# Patient Record
Sex: Male | Born: 1990 | Race: Black or African American | Hispanic: No | Marital: Single
Health system: Southern US, Community
[De-identification: ages and names within clinical notes are randomized; demographics above are authoritative.]

---

## 2019-08-06 ENCOUNTER — Emergency Department
Admission: EM | Admit: 2019-08-06 | Discharge: 2019-08-06 | Disposition: A | Discharge status: Home / Self Care | Payer: BC Managed Care – PPO | Attending: Emergency Medicine | Admitting: Emergency Medicine

## 2019-08-06 ENCOUNTER — Encounter: Payer: Self-pay | Admitting: Radiology

## 2019-08-06 ENCOUNTER — Emergency Department: Payer: BC Managed Care – PPO

## 2019-08-06 ENCOUNTER — Other Ambulatory Visit: Payer: Self-pay

## 2019-08-06 DIAGNOSIS — J029 Acute pharyngitis, unspecified: Secondary | ICD-10-CM | POA: Insufficient documentation

## 2019-08-06 DIAGNOSIS — R51 Headache: Secondary | ICD-10-CM | POA: Diagnosis not present

## 2019-08-06 DIAGNOSIS — M791 Myalgia, unspecified site: Secondary | ICD-10-CM | POA: Insufficient documentation

## 2019-08-06 DIAGNOSIS — A389 Scarlet fever, uncomplicated: Secondary | ICD-10-CM | POA: Diagnosis not present

## 2019-08-06 DIAGNOSIS — Z20828 Contact with and (suspected) exposure to other viral communicable diseases: Secondary | ICD-10-CM | POA: Diagnosis not present

## 2019-08-06 LAB — CBC WITH DIFFERENTIAL/PLATELET
Abs Immature Granulocytes: 0.04 10*3/uL (ref 0.00–0.07)
Basophils Absolute: 0.1 10*3/uL (ref 0.0–0.1)
Basophils Relative: 0 %
Eosinophils Absolute: 0.1 10*3/uL (ref 0.0–0.5)
Eosinophils Relative: 1 %
HCT: 44.8 % (ref 39.0–52.0)
Hemoglobin: 15.7 g/dL (ref 13.0–17.0)
Immature Granulocytes: 0 %
Lymphocytes Relative: 21 %
Lymphs Abs: 2.4 10*3/uL (ref 0.7–4.0)
MCH: 27.6 pg (ref 26.0–34.0)
MCHC: 35 g/dL (ref 30.0–36.0)
MCV: 78.9 fL — ABNORMAL LOW (ref 80.0–100.0)
Monocytes Absolute: 1.6 10*3/uL — ABNORMAL HIGH (ref 0.1–1.0)
Monocytes Relative: 14 %
Neutro Abs: 7.4 10*3/uL (ref 1.7–7.7)
Neutrophils Relative %: 64 %
Platelets: 186 10*3/uL (ref 150–400)
RBC: 5.68 MIL/uL (ref 4.22–5.81)
RDW: 12.5 % (ref 11.5–15.5)
WBC: 11.5 10*3/uL — ABNORMAL HIGH (ref 4.0–10.5)
nRBC: 0 % (ref 0.0–0.2)

## 2019-08-06 LAB — COMPREHENSIVE METABOLIC PANEL
ALT: 18 U/L (ref 0–44)
AST: 22 U/L (ref 15–41)
Albumin: 4.4 g/dL (ref 3.5–5.0)
Alkaline Phosphatase: 48 U/L (ref 38–126)
Anion gap: 10 (ref 5–15)
BUN: 16 mg/dL (ref 6–20)
CO2: 26 mmol/L (ref 22–32)
Calcium: 9 mg/dL (ref 8.9–10.3)
Chloride: 102 mmol/L (ref 98–111)
Creatinine, Ser: 1.33 mg/dL — ABNORMAL HIGH (ref 0.61–1.24)
GFR calc Af Amer: >60 mL/min (ref >60)
GFR calc non Af Amer: >60 mL/min (ref >60)
Glucose, Bld: 106 mg/dL — ABNORMAL HIGH (ref 70–99)
Potassium: 3.5 mmol/L (ref 3.5–5.1)
Sodium: 138 mmol/L (ref 135–145)
Total Bilirubin: 1 mg/dL (ref 0.3–1.2)
Total Protein: 8 g/dL (ref 6.5–8.1)

## 2019-08-06 LAB — SARS CORONAVIRUS 2 BY RT PCR (HOSPITAL ORDER, PERFORMED IN ~~LOC~~ HOSPITAL LAB): SARS Coronavirus 2: NEGATIVE

## 2019-08-06 LAB — LACTIC ACID, PLASMA: Lactic Acid, Venous: 1 mmol/L (ref 0.5–1.9)

## 2019-08-06 LAB — GROUP A STREP BY PCR: Group A Strep by PCR: NOT DETECTED

## 2019-08-06 MED ORDER — IOHEXOL 300 MG/ML  SOLN
75.0000 mL | Freq: Once | INTRAMUSCULAR | Status: AC | PRN
  Administered 2019-08-06: 75 mL via INTRAVENOUS
  Filled 2019-08-06: qty 75

## 2019-08-06 MED ORDER — SODIUM CHLORIDE 0.9 % IV SOLN
3.0000 g | Freq: Once | INTRAVENOUS | Status: AC
  Administered 2019-08-06: 20:00:00 3 g via INTRAVENOUS
  Filled 2019-08-06: qty 8

## 2019-08-06 MED ORDER — DEXAMETHASONE SODIUM PHOSPHATE 10 MG/ML IJ SOLN
10.0000 mg | Freq: Once | INTRAMUSCULAR | Status: AC
  Administered 2019-08-06: 10 mg via INTRAVENOUS
  Filled 2019-08-06 (×2): qty 1

## 2019-08-06 MED ORDER — ACETAMINOPHEN 325 MG PO TABS
ORAL_TABLET | ORAL | Status: AC
  Filled 2019-08-06: qty 2

## 2019-08-06 MED ORDER — AMOXICILLIN-POT CLAVULANATE 875-125 MG PO TABS: 1.0000 | ORAL_TABLET | Freq: Two times a day (BID) | ORAL | 0 refills | Status: AC

## 2019-08-06 MED ORDER — SODIUM CHLORIDE 0.9 % IV BOLUS
1000.0000 mL | Freq: Once | INTRAVENOUS | Status: AC
  Administered 2019-08-06: 1000 mL via INTRAVENOUS

## 2019-08-06 MED ORDER — ACETAMINOPHEN 325 MG PO TABS
650.0000 mg | ORAL_TABLET | Freq: Once | ORAL | Status: AC | PRN
  Administered 2019-08-06: 650 mg via ORAL

## 2019-08-06 NOTE — ED Provider Notes (Signed)
[facility] Emergency Department Provider Note  ____________________________________________  Time seen: Approximately 7:50 PM  I have reviewed the triage vital signs and the nursing notes.   HISTORY  Chief Complaint Sore Throat and Generalized Body Aches    HPI Benjamin Moore is a 28 y.o. male presents to the emergency department with fever, headache, sore throat, body aches and diarrhea for the past 2 to 3 days.  Patient denies nasal congestion, rhinorrhea or nonproductive cough.  Patient states that he does not ordinarily go to the doctor but after several days and symptomatic, "help".  He states that he has been managing his own secretions at home.  He denies recent travel.  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  He denies chest pain, chest tightness or abdominal pain.        History reviewed. No pertinent past medical history.  There are no active problems to display for this patient.     Prior to Admission medications   Medication Sig Start Date End Date Taking? Authorizing Provider  amoxicillin-clavulanate (AUGMENTIN) 875-125 MG tablet Take 1 tablet by mouth 2 (two) times daily for 10 days. 08/06/19 08/16/19  Lannie Fields, PA-C    Allergies Patient has no known allergies.  No family history on file.  Social History Social History   Tobacco Use  . Smoking status: Not on file  Substance Use Topics  . Alcohol use: Not on file  . Drug use: Not on file      Review of Systems  Constitutional: Patient has fever.  Eyes: No visual changes. No discharge ENT: Patient has pharyngitis. Cardiovascular: no chest pain. Respiratory: Patient has cough.  Gastrointestinal: No abdominal pain.  No nausea, no vomiting. Patient had diarrhea.  Genitourinary: Negative for dysuria. No hematuria Musculoskeletal: Patient has myalgias.  Skin: Negative for rash, abrasions, lacerations, ecchymosis. Neurological:  Patient has headache, no focal weakness or numbness.     ____________________________________________   PHYSICAL EXAM:  VITAL SIGNS: ED Triage Vitals  Enc Vitals Group     BP 08/06/19 1904 110/80     Pulse Rate 08/06/19 1806 (!) 130     Resp 08/06/19 1904 20     Temp 08/06/19 1806 (!) 103.2 F (39.6 C)     Temp Source 08/06/19 1806 Oral     SpO2 08/06/19 1904 96 %     Weight 08/06/19 1807 148 lb (67.1 kg)     Height 08/06/19 1807 5\' 5"  (1.651 m)     Head Circumference --      Peak Flow --      Pain Score 08/06/19 1806 0     Pain Loc --      Pain Edu? --      Excl. in [place]? --      Constitutional: Alert and oriented. Patient is lying supine. Eyes: Conjunctivae are normal. PERRL. EOMI. Head: Atraumatic. ENT:      Ears: Tympanic membranes are mildly injected with mild effusion bilaterally.       Nose: No congestion/rhinnorhea.      Mouth/Throat: Mucous membranes are moist.  Posterior pharynx is erythematous.  Patient has very little tonsillar matter left but right tonsil is larger than left.  No uvular deviation. Hematological/Lymphatic/Immunilogical: Palpable cervical lymphadenopathy.  Cardiovascular: Normal rate, regular rhythm. Normal S1 and S2.  Good peripheral circulation. Respiratory: Normal respiratory effort without tachypnea or retractions. Lungs CTAB. Good air entry to the bases with no decreased or absent breath sounds. Gastrointestinal:  Bowel sounds 4 quadrants. Soft and nontender to palpation. No guarding or rigidity. No palpable masses. No distention. No CVA tenderness. Musculoskeletal: Full range of motion to all extremities. No gross deformities appreciated. Neurologic:  Normal speech and language. No gross focal neurologic deficits are appreciated.  Skin: Patient has dry, sandpaperlike rash of face. Psychiatric: Mood and affect are normal. Speech and behavior are normal. Patient exhibits appropriate insight and  judgement.    ____________________________________________   LABS (all labs ordered are listed, but only abnormal results are displayed)  Labs Reviewed  CBC WITH DIFFERENTIAL/PLATELET - Abnormal; Notable for the following components:      Result Value   WBC 11.5 (*)    MCV 78.9 (*)    Monocytes Absolute 1.6 (*)    All other components within normal limits  COMPREHENSIVE METABOLIC PANEL - Abnormal; Notable for the following components:   Glucose, Bld 106 (*)    Creatinine, Ser 1.33 (*)    All other components within normal limits  SARS CORONAVIRUS 2 (HOSPITAL ORDER, PERFORMED IN [facility])  GROUP A STREP BY PCR  CULTURE, BLOOD (ROUTINE X 2)  CULTURE, BLOOD (ROUTINE X 2)  LACTIC ACID, PLASMA  LACTIC ACID, PLASMA   ____________________________________________  EKG   ____________________________________________  RADIOLOGY I personally viewed and evaluated these images as part of my medical decision making, as well as reviewing the written report by the radiologist.    Ct Soft Tissue Neck W Contrast  Result Date: 08/06/2019 CLINICAL DATA:  Sore throat over the last 2 days. EXAM: CT NECK WITH CONTRAST TECHNIQUE: Multidetector CT imaging of the neck was performed using the standard protocol following the bolus administration of intravenous contrast. CONTRAST:  23mL OMNIPAQUE IOHEXOL 300 MG/ML  SOLN COMPARISON:  None. FINDINGS: Pharynx and larynx: Mucosal prominence of the pharyngeal region and tonsils most consistent with inflammatory disease. Mild indistinct low density of the right tonsil without discrete tonsillar or peritonsillar abscess. No retropharyngeal edema or abscess. Salivary glands: Parotid and submandibular glands are normal. Thyroid: Normal Lymph nodes: Mild reactive nodal prominence without dominant enlarged node or suppuration. Vascular: Normal Limited intracranial: Normal Visualized orbits: Normal Mastoids and visualized paranasal sinuses: Clear  Skeleton: Normal Upper chest: Normal Other: None IMPRESSION: Consistent with pharyngitis and tonsillitis. No evidence of abscess. No retropharyngeal disease. Mild reactive nodal prominence. Electronically Signed   By: Paulina Fusi M.D.   On: 08/06/2019 20:11    ____________________________________________    PROCEDURES  Procedure(s) performed:    Procedures    Medications  acetaminophen (TYLENOL) tablet 650 mg (650 mg Oral Given 08/06/19 1813)  Ampicillin-Sulbactam (UNASYN) 3 g in sodium chloride 0.9 % 100 mL IVPB (0 g Intravenous Stopped 08/06/19 2005)  sodium chloride 0.9 % bolus 1,000 mL (0 mLs Intravenous Stopped 08/06/19 2106)  dexamethasone (DECADRON) injection 10 mg (10 mg Intravenous Given 08/06/19 1935)  iohexol (OMNIPAQUE) 300 MG/ML solution 75 mL (75 mLs Intravenous Contrast Given 08/06/19 1956)     ____________________________________________   INITIAL IMPRESSION / ASSESSMENT AND PLAN / ED COURSE  Pertinent labs & imaging results that were available during my care of the patient were reviewed by me and considered in my medical decision making (see chart for details).  Review of the [place] CSRS was performed in accordance of the [facility] prior to dispensing any controlled drugs.           Assessment and Plan:  Severe pharyngitis 28 year old male presents to the emergency department with pharyngitis, headache, body aches and  nausea for the past 2 to 3 days.  Patient was febrile and tachycardic at triage.  Right tonsil appears larger than left on physical exam and patient had tender anterior cervical lymphadenopathy.  Patient had evidence of dry, papular rash of face.  Differential diagnosis includes peritonsillar abscess, group A strep pharyngitis, mono, COVID-19...  Patient was given supplemental fluids in the emergency department as well as Unasyn.  Patient was also given IV Decadron.  Group A strep testing was negative.  COVID-19 testing was negative in the emergency  department.  CT soft tissue neck revealed no evidence of peritonsillar abscess.  Headache, nausea, pharyngitis, tender anterior cervical lymphadenopathy and scarlatina increase suspicion for group A strep pharyngitis.  Patient was discharged with Augmentin.  Strict return precautions were given to return to the emergency department with new or worsening symptoms.  All patient questions were answered.  ____________________________________________  FINAL CLINICAL IMPRESSION(S) / ED DIAGNOSES  Final diagnoses:  Pharyngitis, unspecified etiology  Scarlatina      NEW MEDICATIONS STARTED DURING THIS VISIT:  ED Discharge Orders         Ordered    amoxicillin-clavulanate (AUGMENTIN) 875-125 MG tablet  2 times daily     08/06/19 2050              This chart was dictated using voice recognition software/Dragon. Despite best efforts to proofread, errors can occur which can change the meaning. Any change was purely unintentional.    Gasper LloydWoods, Maquita Sandoval M, PA-C 08/06/19 2155    Sharman CheekStafford, Phillip, MD 08/11/19 [phone]

## 2019-08-06 NOTE — ED Notes (Signed)
Patient transported to CT 

## 2019-08-06 NOTE — ED Triage Notes (Signed)
Pt reports for the past 2 days he has been having some sore throat, headache, body aches, and feeling hot with a little diarrhea, states most of his symptoms bother him at night.

## 2019-08-06 NOTE — ED Notes (Signed)
See triage note.  States he developed some diarrhea couple of days ago  Now fever with sore throat ,h/a and body aches

## 2019-08-11 LAB — CULTURE, BLOOD (ROUTINE X 2)
Culture: NO GROWTH
Culture: NO GROWTH

## 2020-10-21 IMAGING — CT CT NECK W/ CM
4 series · 15 of 33 positions shown, 18 images · IV contrast (omnipaque)
Comparison: None.

CLINICAL DATA: Sore throat over the last 2 days.

EXAM:
CT NECK WITH CONTRAST
TECHNIQUE: Multidetector CT imaging of the neck was performed using the
standard protocol following the bolus administration of intravenous
contrast.
CONTRAST:  75mL OMNIPAQUE IOHEXOL 300 MG/ML  SOLN

[Series 2: axial neck · axial · 0.55mm/px · z∈[+227,+367]mm · 5 of 106 slices shown, 7 images]
[im 18/106  soft-tissue]
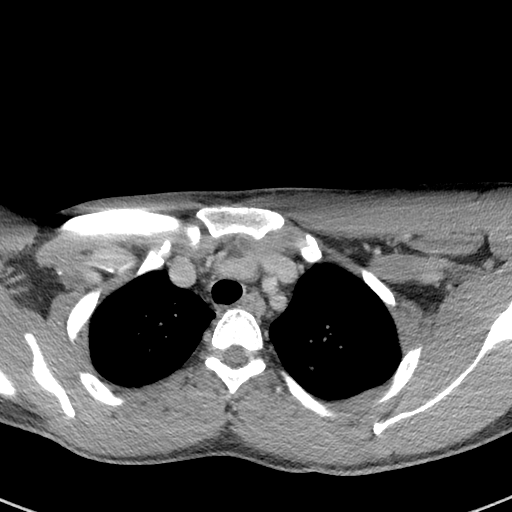
[im 18/106  bone]
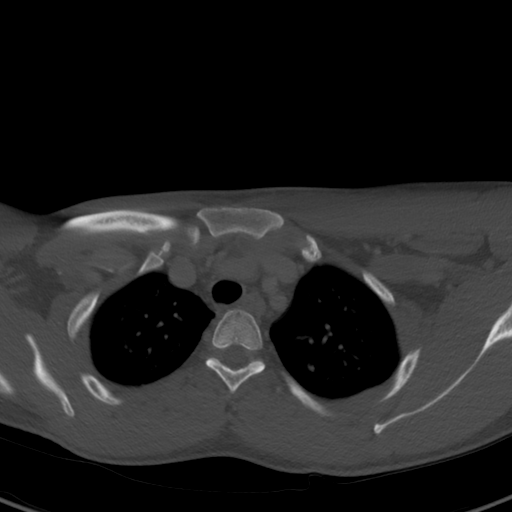
[im 36/106  bone]
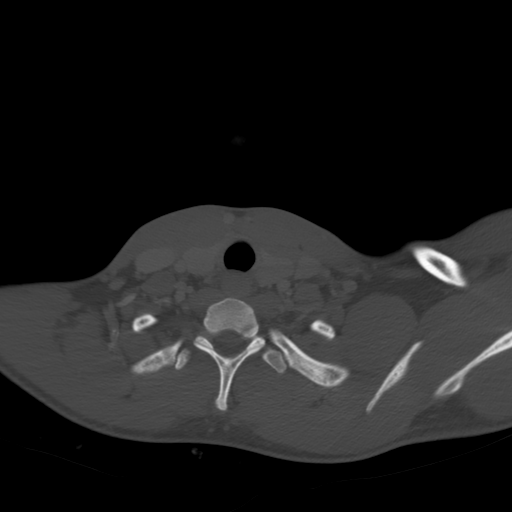
[im 53/106  bone]
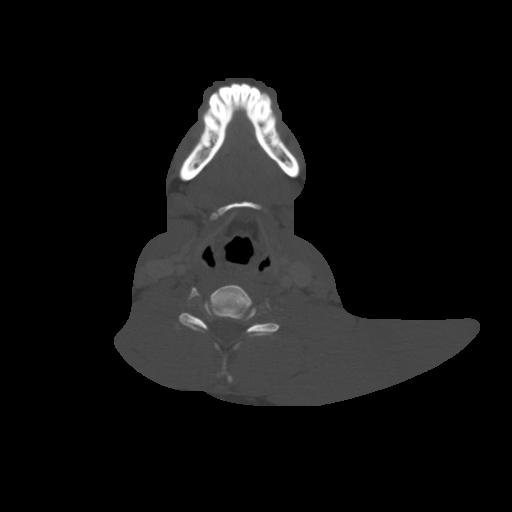
[im 71/106  bone]
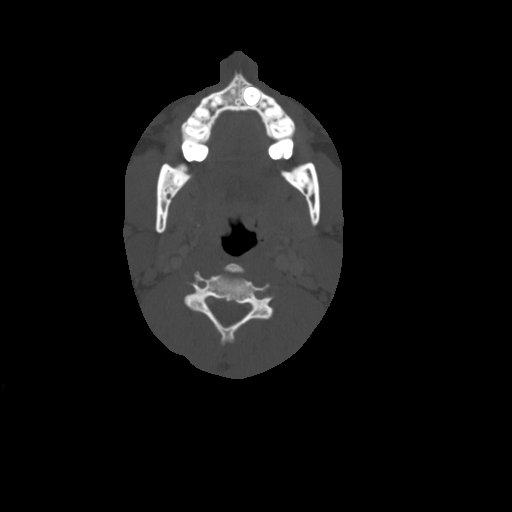
[im 88/106  soft-tissue]
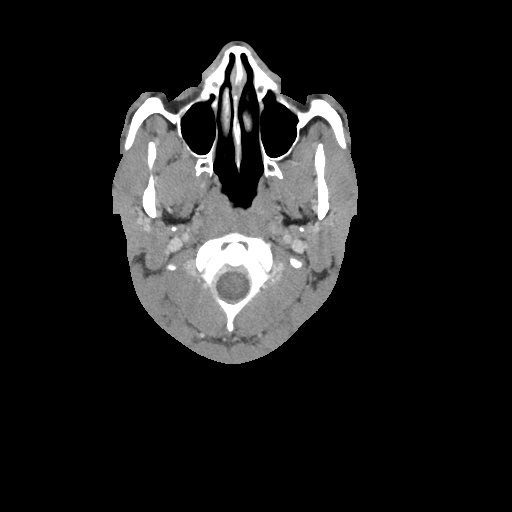
[im 88/106  bone]
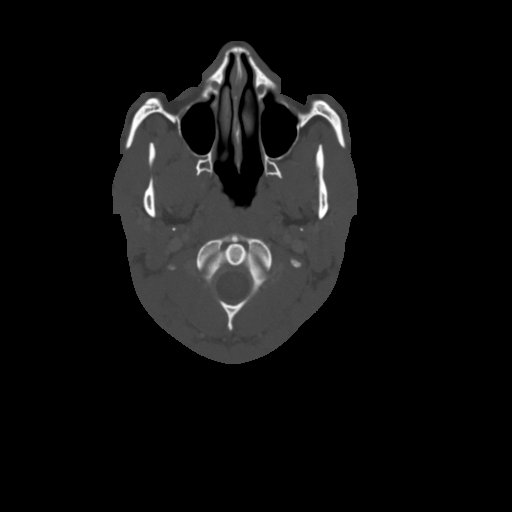

[Series 5: sag neck · sagittal · 0.41mm/px · 5 of 77 slices shown, 6 images]
[im 26/77  bone]
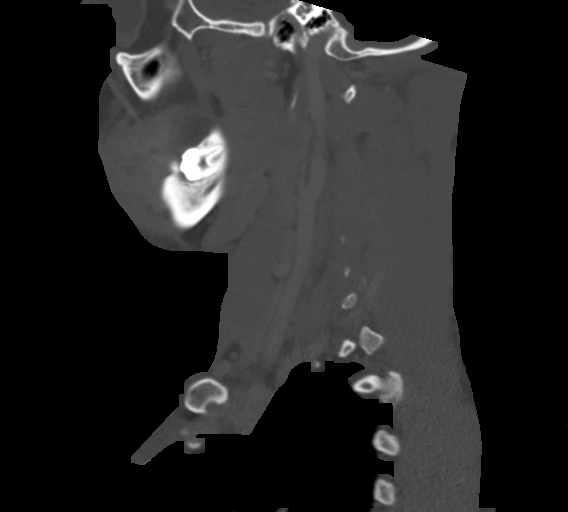
[im 32/77  bone]
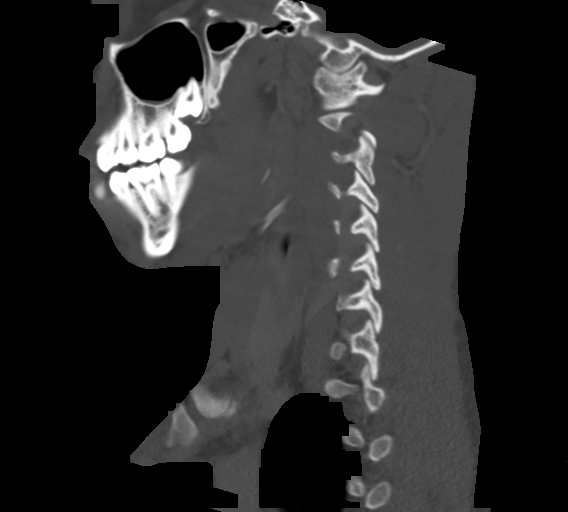
[im 39/77  soft-tissue]
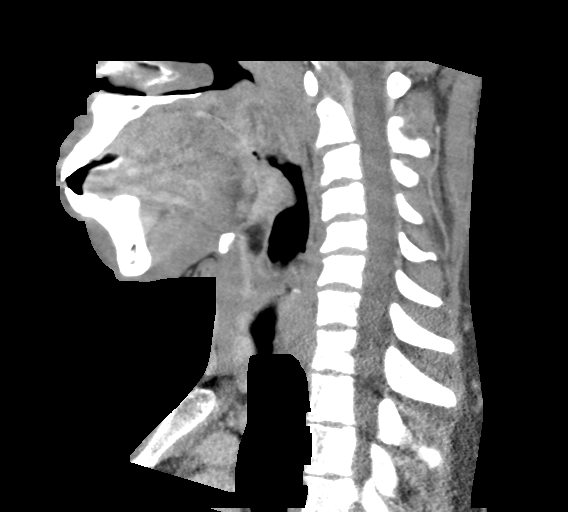
[im 39/77  bone]
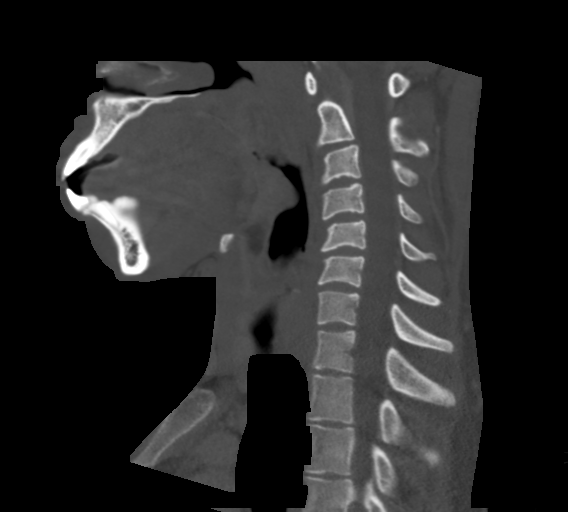
[im 45/77  bone]
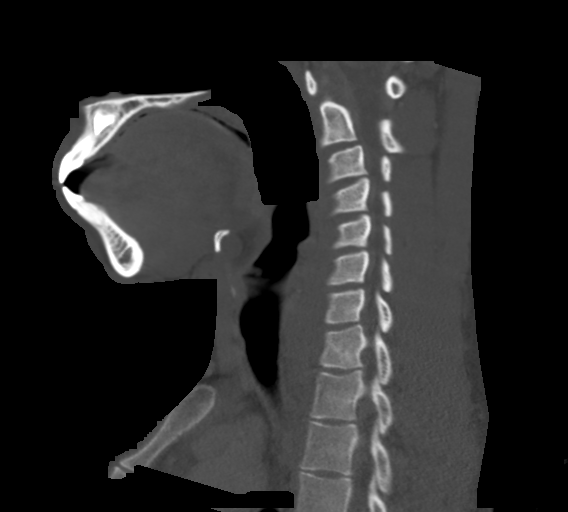
[im 51/77  bone]
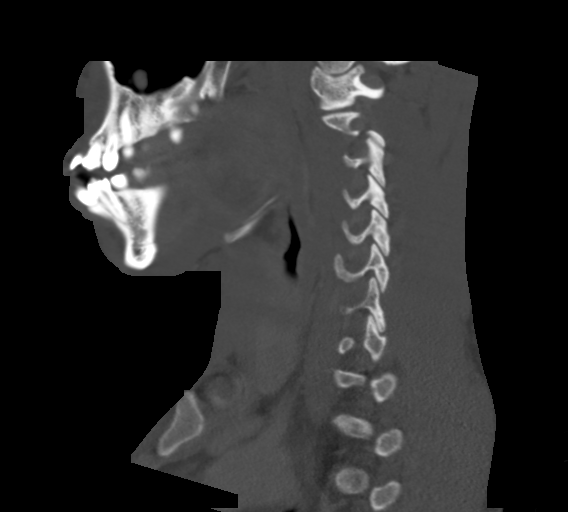

[Series 6: cor neck · coronal · 0.32mm/px · 3 of 99 slices shown]
[im 25/99  bone]
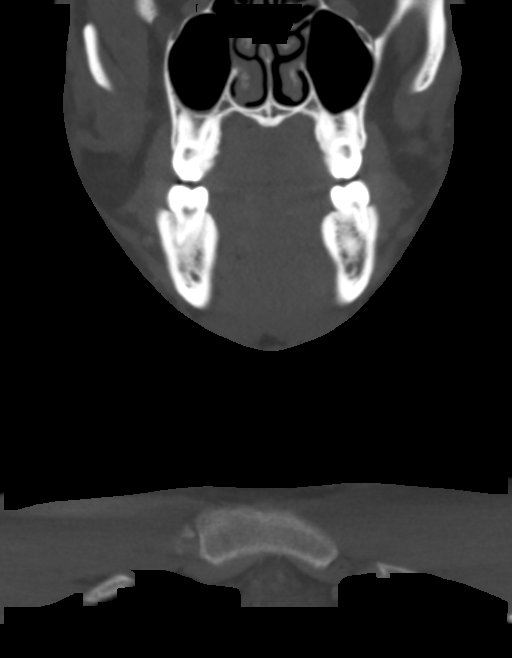
[im 41/99  bone]
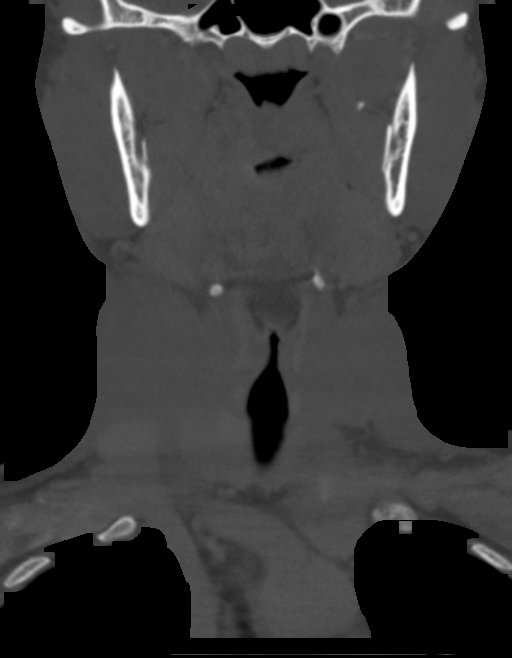
[im 58/99  bone]
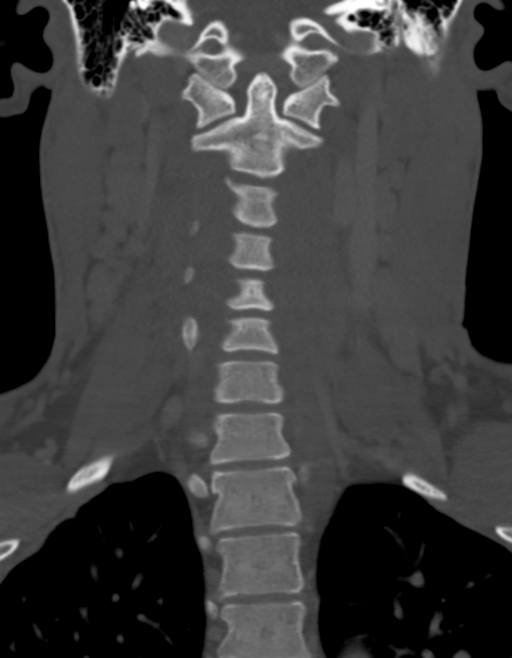

[Series 7: orthogonal ax · axial · 0.39mm/px · z∈[+194,+228]mm · 2 of 106 slices shown]
[im 18/106  bone]
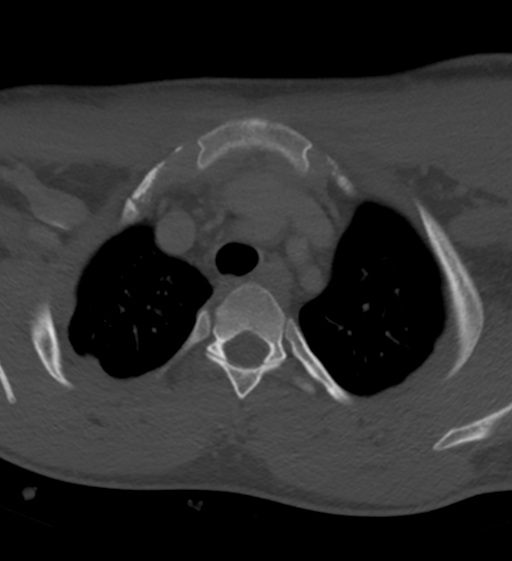
[im 36/106  bone]
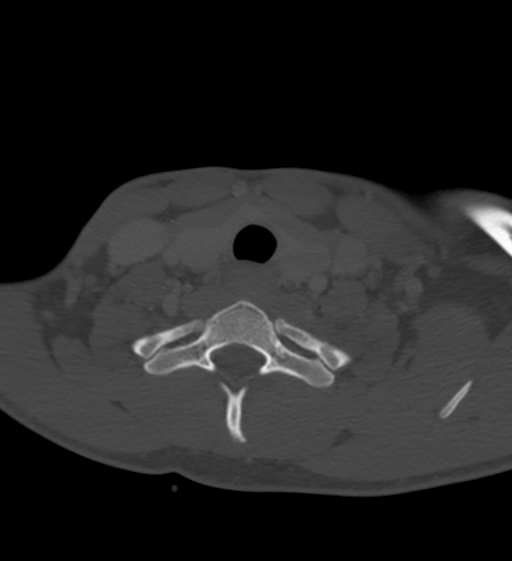

[15 of 33 positions shown; findings below may reference images not displayed]

FINDINGS: Pharynx and larynx: Mucosal prominence of the pharyngeal region and
tonsils most consistent with inflammatory disease. Mild indistinct
low density of the right tonsil without discrete tonsillar or
peritonsillar abscess. No retropharyngeal edema or abscess.

Salivary glands: Parotid and submandibular glands are normal.

Thyroid: Normal

Lymph nodes: Mild reactive nodal prominence without dominant
enlarged node or suppuration.

Vascular: Normal

Limited intracranial: Normal

Visualized orbits: Normal

Mastoids and visualized paranasal sinuses: Clear

Skeleton: Normal

Upper chest: Normal

Other: None
IMPRESSION: Consistent with pharyngitis and tonsillitis. No evidence of abscess.
No retropharyngeal disease. Mild reactive nodal prominence.
